# Patient Record
Sex: Female | Born: 1935 | Race: White | Hispanic: No | Marital: Married
Health system: Southern US, Community
[De-identification: ages and names within clinical notes are randomized; demographics above are authoritative.]

---

## 2009-11-06 ENCOUNTER — Ambulatory Visit: Payer: Self-pay | Admitting: Specialist

## 2009-11-07 ENCOUNTER — Ambulatory Visit: Payer: Self-pay | Admitting: Internal Medicine

## 2010-10-25 ENCOUNTER — Ambulatory Visit: Payer: Self-pay | Admitting: Internal Medicine

## 2010-10-30 ENCOUNTER — Ambulatory Visit: Payer: Self-pay | Admitting: Emergency Medicine

## 2010-10-31 ENCOUNTER — Ambulatory Visit: Payer: Self-pay | Admitting: Emergency Medicine

## 2010-11-05 ENCOUNTER — Ambulatory Visit: Payer: Self-pay | Admitting: Emergency Medicine

## 2010-11-27 ENCOUNTER — Ambulatory Visit: Payer: Self-pay | Admitting: Internal Medicine

## 2012-03-02 ENCOUNTER — Ambulatory Visit: Payer: Self-pay | Admitting: Internal Medicine

## 2013-03-03 ENCOUNTER — Ambulatory Visit: Payer: Self-pay | Admitting: Internal Medicine

## 2013-07-28 ENCOUNTER — Ambulatory Visit: Payer: Self-pay | Admitting: Internal Medicine

## 2014-01-18 ENCOUNTER — Ambulatory Visit: Payer: Self-pay | Admitting: Internal Medicine

## 2014-01-21 ENCOUNTER — Inpatient Hospital Stay: Payer: Self-pay | Admitting: Internal Medicine

## 2014-01-21 LAB — CBC WITH DIFFERENTIAL/PLATELET
BASOS ABS: 0 10*3/uL (ref 0.0–0.1)
Basophil %: 0.3 %
Eosinophil #: 0 10*3/uL (ref 0.0–0.7)
Eosinophil %: 0.2 %
HCT: 25.7 % — AB (ref 35.0–47.0)
HGB: 8.7 g/dL — AB (ref 12.0–16.0)
Lymphocyte #: 0.8 10*3/uL — ABNORMAL LOW (ref 1.0–3.6)
Lymphocyte %: 15.1 %
MCH: 30.5 pg (ref 26.0–34.0)
MCHC: 33.8 g/dL (ref 32.0–36.0)
MCV: 90 fL (ref 80–100)
Monocyte #: 0.8 x10 3/mm (ref 0.2–0.9)
Monocyte %: 14.1 %
NEUTROS ABS: 3.8 10*3/uL (ref 1.4–6.5)
NEUTROS PCT: 70.3 %
Platelet: 92 10*3/uL — ABNORMAL LOW (ref 150–440)
RBC: 2.84 10*6/uL — ABNORMAL LOW (ref 3.80–5.20)
RDW: 20.4 % — AB (ref 11.5–14.5)
WBC: 5.4 10*3/uL (ref 3.6–11.0)

## 2014-01-21 LAB — COMPREHENSIVE METABOLIC PANEL
AST: 25 U/L (ref 15–37)
Albumin: 3 g/dL — ABNORMAL LOW (ref 3.4–5.0)
Alkaline Phosphatase: 76 U/L
Anion Gap: 12 (ref 7–16)
BUN: 69 mg/dL — ABNORMAL HIGH (ref 7–18)
Bilirubin,Total: 1.3 mg/dL — ABNORMAL HIGH (ref 0.2–1.0)
CHLORIDE: 109 mmol/L — AB (ref 98–107)
Calcium, Total: 8.8 mg/dL (ref 8.5–10.1)
Co2: 22 mmol/L (ref 21–32)
Creatinine: 3.8 mg/dL — ABNORMAL HIGH (ref 0.60–1.30)
EGFR (African American): 12 — ABNORMAL LOW
EGFR (Non-African Amer.): 10 — ABNORMAL LOW
GLUCOSE: 165 mg/dL — AB (ref 65–99)
OSMOLALITY: 309 (ref 275–301)
POTASSIUM: 3.6 mmol/L (ref 3.5–5.1)
SGPT (ALT): 23 U/L (ref 12–78)
Sodium: 143 mmol/L (ref 136–145)
Total Protein: 6.6 g/dL (ref 6.4–8.2)

## 2014-01-21 LAB — CBC
HCT: 26.1 % — AB (ref 35.0–47.0)
HGB: 8.8 g/dL — AB (ref 12.0–16.0)
MCH: 32.4 pg (ref 26.0–34.0)
MCHC: 33.6 g/dL (ref 32.0–36.0)
MCV: 96 fL (ref 80–100)
Platelet: 177 10*3/uL (ref 150–440)
RBC: 2.71 10*6/uL — ABNORMAL LOW (ref 3.80–5.20)
RDW: 15.5 % — ABNORMAL HIGH (ref 11.5–14.5)
WBC: 8.3 10*3/uL (ref 3.6–11.0)

## 2014-01-21 LAB — PROTIME-INR
INR: >15.1
INR: 1.2
Prothrombin Time: 15.4 secs — ABNORMAL HIGH (ref 11.5–14.7)

## 2014-01-21 LAB — TROPONIN I

## 2014-01-21 LAB — CK TOTAL AND CKMB (NOT AT ARMC)
CK, TOTAL: 182 U/L
CK-MB: 1.1 ng/mL (ref 0.5–3.6)

## 2014-01-21 LAB — HEMOGLOBIN: HGB: 7.3 g/dL — AB (ref 12.0–16.0)

## 2014-01-22 LAB — CBC WITH DIFFERENTIAL/PLATELET
Basophil #: 0 10*3/uL (ref 0.0–0.1)
Basophil %: 0.5 %
EOS ABS: 0 10*3/uL (ref 0.0–0.7)
Eosinophil %: 0.8 %
HCT: 25 % — ABNORMAL LOW (ref 35.0–47.0)
HGB: 8.5 g/dL — AB (ref 12.0–16.0)
Lymphocyte #: 0.6 10*3/uL — ABNORMAL LOW (ref 1.0–3.6)
Lymphocyte %: 15.8 %
MCH: 30.8 pg (ref 26.0–34.0)
MCHC: 34.1 g/dL (ref 32.0–36.0)
MCV: 90 fL (ref 80–100)
Monocyte #: 0.6 x10 3/mm (ref 0.2–0.9)
Monocyte %: 13.9 %
NEUTROS ABS: 2.8 10*3/uL (ref 1.4–6.5)
Neutrophil %: 69 %
Platelet: 87 10*3/uL — ABNORMAL LOW (ref 150–440)
RBC: 2.76 10*6/uL — AB (ref 3.80–5.20)
RDW: 20.1 % — AB (ref 11.5–14.5)
WBC: 4.1 10*3/uL (ref 3.6–11.0)

## 2014-01-22 LAB — BASIC METABOLIC PANEL
ANION GAP: 7 (ref 7–16)
BUN: 66 mg/dL — ABNORMAL HIGH (ref 7–18)
CALCIUM: 8.3 mg/dL — AB (ref 8.5–10.1)
Chloride: 113 mmol/L — ABNORMAL HIGH (ref 98–107)
Co2: 27 mmol/L (ref 21–32)
Creatinine: 3.13 mg/dL — ABNORMAL HIGH (ref 0.60–1.30)
EGFR (African American): 15 — ABNORMAL LOW
EGFR (Non-African Amer.): 13 — ABNORMAL LOW
Glucose: 98 mg/dL (ref 65–99)
Osmolality: 311 (ref 275–301)
Potassium: 3.3 mmol/L — ABNORMAL LOW (ref 3.5–5.1)
Sodium: 147 mmol/L — ABNORMAL HIGH (ref 136–145)

## 2014-01-22 LAB — HEMOGLOBIN
HGB: 9.1 g/dL — ABNORMAL LOW (ref 12.0–16.0)
HGB: 8.9 g/dL — ABNORMAL LOW (ref 12.0–16.0)

## 2014-01-23 LAB — BASIC METABOLIC PANEL
Anion Gap: 8 (ref 7–16)
BUN: 55 mg/dL — ABNORMAL HIGH (ref 7–18)
CALCIUM: 8.4 mg/dL — AB (ref 8.5–10.1)
CO2: 26 mmol/L (ref 21–32)
CREATININE: 2.65 mg/dL — AB (ref 0.60–1.30)
Chloride: 115 mmol/L — ABNORMAL HIGH (ref 98–107)
EGFR (African American): 19 — ABNORMAL LOW
GFR CALC NON AF AMER: 16 — AB
Glucose: 108 mg/dL — ABNORMAL HIGH (ref 65–99)
OSMOLALITY: 312 (ref 275–301)
Potassium: 3.2 mmol/L — ABNORMAL LOW (ref 3.5–5.1)
SODIUM: 149 mmol/L — AB (ref 136–145)

## 2014-01-23 LAB — HEMOGLOBIN
HGB: 8.8 g/dL — AB (ref 12.0–16.0)
HGB: 10.5 g/dL — ABNORMAL LOW (ref 12.0–16.0)

## 2014-01-23 LAB — CANCER ANTIGEN 19-9: CA 19 9: 1647 U/mL — AB (ref 0–35)

## 2014-01-24 LAB — HEMOGLOBIN
HGB: 9.9 g/dL — AB (ref 12.0–16.0)
HGB: 9.1 g/dL — ABNORMAL LOW (ref 12.0–16.0)

## 2014-01-24 LAB — BASIC METABOLIC PANEL
ANION GAP: 8 (ref 7–16)
BUN: 44 mg/dL — ABNORMAL HIGH (ref 7–18)
CALCIUM: 8.4 mg/dL — AB (ref 8.5–10.1)
CO2: 25 mmol/L (ref 21–32)
Chloride: 116 mmol/L — ABNORMAL HIGH (ref 98–107)
Creatinine: 2.32 mg/dL — ABNORMAL HIGH (ref 0.60–1.30)
EGFR (Non-African Amer.): 19 — ABNORMAL LOW
GFR CALC AF AMER: 22 — AB
GLUCOSE: 121 mg/dL — AB (ref 65–99)
Osmolality: 309 (ref 275–301)
Potassium: 3.4 mmol/L — ABNORMAL LOW (ref 3.5–5.1)
Sodium: 149 mmol/L — ABNORMAL HIGH (ref 136–145)

## 2014-01-24 LAB — MAGNESIUM: MAGNESIUM: 1.8 mg/dL

## 2014-01-25 LAB — HEMOGLOBIN
HGB: 10.5 g/dL — ABNORMAL LOW (ref 12.0–16.0)
HGB: 9.6 g/dL — ABNORMAL LOW (ref 12.0–16.0)

## 2014-01-26 LAB — COMPREHENSIVE METABOLIC PANEL
ALBUMIN: 2.5 g/dL — AB (ref 3.4–5.0)
ALK PHOS: 192 U/L — AB
ANION GAP: 6 — AB (ref 7–16)
AST: 169 U/L — AB (ref 15–37)
BILIRUBIN TOTAL: 6.9 mg/dL — AB (ref 0.2–1.0)
BUN: 37 mg/dL — AB (ref 7–18)
CHLORIDE: 111 mmol/L — AB (ref 98–107)
Calcium, Total: 8.5 mg/dL (ref 8.5–10.1)
Co2: 26 mmol/L (ref 21–32)
Creatinine: 2.47 mg/dL — ABNORMAL HIGH (ref 0.60–1.30)
EGFR (Non-African Amer.): 17 — ABNORMAL LOW
GFR CALC AF AMER: 20 — AB
GLUCOSE: 123 mg/dL — AB (ref 65–99)
Osmolality: 295 (ref 275–301)
POTASSIUM: 3.4 mmol/L — AB (ref 3.5–5.1)
SGPT (ALT): 176 U/L — ABNORMAL HIGH (ref 12–78)
Sodium: 143 mmol/L (ref 136–145)
TOTAL PROTEIN: 5.4 g/dL — AB (ref 6.4–8.2)

## 2014-01-27 DIAGNOSIS — D62 Acute posthemorrhagic anemia: Secondary | ICD-10-CM

## 2014-01-27 DIAGNOSIS — M129 Arthropathy, unspecified: Secondary | ICD-10-CM

## 2014-01-27 DIAGNOSIS — K922 Gastrointestinal hemorrhage, unspecified: Secondary | ICD-10-CM

## 2014-01-27 DIAGNOSIS — N189 Chronic kidney disease, unspecified: Secondary | ICD-10-CM

## 2014-01-27 DIAGNOSIS — C259 Malignant neoplasm of pancreas, unspecified: Secondary | ICD-10-CM

## 2014-02-02 ENCOUNTER — Ambulatory Visit: Payer: Self-pay | Admitting: Oncology

## 2014-02-02 LAB — CBC CANCER CENTER
Basophil #: 0 x10 3/mm (ref 0.0–0.1)
Basophil %: 0.8 %
EOS PCT: 0.5 %
Eosinophil #: 0 x10 3/mm (ref 0.0–0.7)
HCT: 30.3 % — AB (ref 35.0–47.0)
HGB: 10.1 g/dL — ABNORMAL LOW (ref 12.0–16.0)
LYMPHS PCT: 9.7 %
Lymphocyte #: 0.6 x10 3/mm — ABNORMAL LOW (ref 1.0–3.6)
MCH: 30.8 pg (ref 26.0–34.0)
MCHC: 33.4 g/dL (ref 32.0–36.0)
MCV: 92 fL (ref 80–100)
MONO ABS: 0.7 x10 3/mm (ref 0.2–0.9)
Monocyte %: 11.2 %
NEUTROS ABS: 5 x10 3/mm (ref 1.4–6.5)
Neutrophil %: 77.8 %
PLATELETS: 151 x10 3/mm (ref 150–440)
RBC: 3.29 10*6/uL — ABNORMAL LOW (ref 3.80–5.20)
RDW: 21.6 % — ABNORMAL HIGH (ref 11.5–14.5)
WBC: 6.4 x10 3/mm (ref 3.6–11.0)

## 2014-02-02 LAB — COMPREHENSIVE METABOLIC PANEL
ALBUMIN: 2.7 g/dL — AB (ref 3.4–5.0)
ALK PHOS: 736 U/L — AB
Anion Gap: 11 (ref 7–16)
BUN: 50 mg/dL — ABNORMAL HIGH (ref 7–18)
Bilirubin,Total: 18.6 mg/dL — ABNORMAL HIGH (ref 0.2–1.0)
CREATININE: 2.87 mg/dL — AB (ref 0.60–1.30)
Calcium, Total: 9.4 mg/dL (ref 8.5–10.1)
Chloride: 102 mmol/L (ref 98–107)
Co2: 31 mmol/L (ref 21–32)
EGFR (Non-African Amer.): 15 — ABNORMAL LOW
GFR CALC AF AMER: 17 — AB
GLUCOSE: 132 mg/dL — AB (ref 65–99)
OSMOLALITY: 302 (ref 275–301)
POTASSIUM: 2.8 mmol/L — AB (ref 3.5–5.1)
SGOT(AST): 372 U/L — ABNORMAL HIGH (ref 15–37)
SGPT (ALT): 378 U/L — ABNORMAL HIGH (ref 12–78)
SODIUM: 144 mmol/L (ref 136–145)
TOTAL PROTEIN: 6.1 g/dL — AB (ref 6.4–8.2)

## 2014-02-03 LAB — CANCER ANTIGEN 19-9: CA 19-9: 3470 U/mL — ABNORMAL HIGH (ref 0–35)

## 2014-02-08 DIAGNOSIS — N179 Acute kidney failure, unspecified: Secondary | ICD-10-CM

## 2014-02-08 LAB — COMPREHENSIVE METABOLIC PANEL
ANION GAP: 6 — AB (ref 7–16)
Albumin: 2.6 g/dL — ABNORMAL LOW (ref 3.4–5.0)
Alkaline Phosphatase: 398 U/L — ABNORMAL HIGH
BUN: 45 mg/dL — AB (ref 7–18)
Bilirubin,Total: 4.6 mg/dL — ABNORMAL HIGH (ref 0.2–1.0)
CHLORIDE: 102 mmol/L (ref 98–107)
Calcium, Total: 8.7 mg/dL (ref 8.5–10.1)
Co2: 31 mmol/L (ref 21–32)
Creatinine: 3.1 mg/dL — ABNORMAL HIGH (ref 0.60–1.30)
EGFR (Non-African Amer.): 13 — ABNORMAL LOW
GFR CALC AF AMER: 15 — AB
Glucose: 191 mg/dL — ABNORMAL HIGH (ref 65–99)
OSMOLALITY: 294 (ref 275–301)
POTASSIUM: 4 mmol/L (ref 3.5–5.1)
SGOT(AST): 62 U/L — ABNORMAL HIGH (ref 15–37)
SGPT (ALT): 133 U/L — ABNORMAL HIGH (ref 12–78)
Sodium: 139 mmol/L (ref 136–145)
Total Protein: 6.4 g/dL (ref 6.4–8.2)

## 2014-02-08 LAB — CBC CANCER CENTER
BANDS NEUTROPHIL: 1 %
EOS PCT: 1 %
HCT: 32.1 % — ABNORMAL LOW (ref 35.0–47.0)
HGB: 10.6 g/dL — AB (ref 12.0–16.0)
LYMPHS PCT: 8 %
MCH: 32.4 pg (ref 26.0–34.0)
MCHC: 33.1 g/dL (ref 32.0–36.0)
MCV: 98 fL (ref 80–100)
Monocytes: 6 %
PLATELETS: 149 x10 3/mm — AB (ref 150–440)
RBC: 3.29 10*6/uL — ABNORMAL LOW (ref 3.80–5.20)
RDW: 22.5 % — AB (ref 11.5–14.5)
SEGMENTED NEUTROPHILS: 84 %
WBC: 7.4 x10 3/mm (ref 3.6–11.0)

## 2014-02-13 ENCOUNTER — Ambulatory Visit: Payer: Self-pay | Admitting: Family Medicine

## 2014-02-17 ENCOUNTER — Ambulatory Visit: Payer: Self-pay | Admitting: Oncology

## 2014-02-17 ENCOUNTER — Ambulatory Visit: Payer: Self-pay | Admitting: Internal Medicine

## 2014-02-24 DIAGNOSIS — R791 Abnormal coagulation profile: Secondary | ICD-10-CM

## 2014-02-24 DIAGNOSIS — C259 Malignant neoplasm of pancreas, unspecified: Secondary | ICD-10-CM

## 2014-02-24 DIAGNOSIS — M129 Arthropathy, unspecified: Secondary | ICD-10-CM

## 2014-02-24 DIAGNOSIS — K922 Gastrointestinal hemorrhage, unspecified: Secondary | ICD-10-CM

## 2014-02-27 ENCOUNTER — Ambulatory Visit: Payer: Self-pay | Admitting: Vascular Surgery

## 2014-03-03 LAB — CBC CANCER CENTER
BASOS ABS: 0.1 x10 3/mm (ref 0.0–0.1)
Basophil %: 0.9 %
EOS ABS: 0.2 x10 3/mm (ref 0.0–0.7)
Eosinophil %: 2.4 %
HCT: 32.8 % — ABNORMAL LOW (ref 35.0–47.0)
HGB: 10.9 g/dL — ABNORMAL LOW (ref 12.0–16.0)
Lymphocyte #: 0.6 x10 3/mm — ABNORMAL LOW (ref 1.0–3.6)
Lymphocyte %: 9.1 %
MCH: 32.9 pg (ref 26.0–34.0)
MCHC: 33.2 g/dL (ref 32.0–36.0)
MCV: 99 fL (ref 80–100)
Monocyte #: 0.8 x10 3/mm (ref 0.2–0.9)
Monocyte %: 11.7 %
NEUTROS ABS: 5.1 x10 3/mm (ref 1.4–6.5)
Neutrophil %: 75.9 %
Platelet: 84 x10 3/mm — ABNORMAL LOW (ref 150–440)
RBC: 3.31 10*6/uL — ABNORMAL LOW (ref 3.80–5.20)
RDW: 15.9 % — ABNORMAL HIGH (ref 11.5–14.5)
WBC: 6.8 x10 3/mm (ref 3.6–11.0)

## 2014-03-03 LAB — COMPREHENSIVE METABOLIC PANEL
ALK PHOS: 135 U/L — AB
AST: 24 U/L (ref 15–37)
Albumin: 2.7 g/dL — ABNORMAL LOW (ref 3.4–5.0)
Anion Gap: 9 (ref 7–16)
BUN: 48 mg/dL — ABNORMAL HIGH (ref 7–18)
Bilirubin,Total: 2.1 mg/dL — ABNORMAL HIGH (ref 0.2–1.0)
CHLORIDE: 106 mmol/L (ref 98–107)
Calcium, Total: 8.4 mg/dL — ABNORMAL LOW (ref 8.5–10.1)
Co2: 28 mmol/L (ref 21–32)
Creatinine: 2.98 mg/dL — ABNORMAL HIGH (ref 0.60–1.30)
GFR CALC AF AMER: 16 — AB
GFR CALC NON AF AMER: 14 — AB
GLUCOSE: 169 mg/dL — AB (ref 65–99)
OSMOLALITY: 302 (ref 275–301)
POTASSIUM: 4 mmol/L (ref 3.5–5.1)
SGPT (ALT): 13 U/L (ref 12–78)
Sodium: 143 mmol/L (ref 136–145)
TOTAL PROTEIN: 6.1 g/dL — AB (ref 6.4–8.2)

## 2014-03-07 ENCOUNTER — Telehealth: Payer: Self-pay

## 2014-03-07 NOTE — Telephone Encounter (Signed)
Jeanette Graham with [facility] left v/m; Hospice had received referral for hospice and pt was getting therapy at [facility] and pt was using her medicare days so Hospice could not see pt at that time but Jeanette Graham understands pt is returning to independent living at [facility] and does Dr Deborra Medina still wanting [facility] to see pt.Jeanette Graham request cb.

## 2014-03-08 NOTE — Telephone Encounter (Signed)
Spoke to [facility] and advised per Dr Deborra Medina; verbally expressed understanding.

## 2014-03-08 NOTE — Telephone Encounter (Signed)
I am not her PCP outside of [place] but I would highly recommend her still seeing hospice outside of [place].  This would be up to the patient.

## 2014-03-09 DIAGNOSIS — S8010XA Contusion of unspecified lower leg, initial encounter: Secondary | ICD-10-CM

## 2014-03-09 DIAGNOSIS — C259 Malignant neoplasm of pancreas, unspecified: Secondary | ICD-10-CM

## 2014-03-09 DIAGNOSIS — R5381 Other malaise: Secondary | ICD-10-CM

## 2014-03-09 DIAGNOSIS — R5383 Other fatigue: Secondary | ICD-10-CM

## 2014-03-09 DIAGNOSIS — W19XXXA Unspecified fall, initial encounter: Secondary | ICD-10-CM

## 2014-03-10 LAB — CBC CANCER CENTER
Basophil #: 0 x10 3/mm (ref 0.0–0.1)
Basophil %: 0.5 %
Eosinophil #: 0.1 x10 3/mm (ref 0.0–0.7)
Eosinophil %: 2.7 %
HCT: 28.6 % — ABNORMAL LOW (ref 35.0–47.0)
HGB: 9.6 g/dL — AB (ref 12.0–16.0)
LYMPHS ABS: 0.3 x10 3/mm — AB (ref 1.0–3.6)
Lymphocyte %: 15 %
MCH: 33.3 pg (ref 26.0–34.0)
MCHC: 33.6 g/dL (ref 32.0–36.0)
MCV: 99 fL (ref 80–100)
Monocyte #: 0.1 x10 3/mm — ABNORMAL LOW (ref 0.2–0.9)
Monocyte %: 3.4 %
Neutrophil #: 1.7 x10 3/mm (ref 1.4–6.5)
Neutrophil %: 78.4 %
Platelet: 23 x10 3/mm — CL (ref 150–440)
RBC: 2.88 10*6/uL — AB (ref 3.80–5.20)
RDW: 14.6 % — AB (ref 11.5–14.5)
WBC: 2.1 x10 3/mm — ABNORMAL LOW (ref 3.6–11.0)

## 2014-03-10 LAB — COMPREHENSIVE METABOLIC PANEL
ALBUMIN: 2.5 g/dL — AB (ref 3.4–5.0)
ALK PHOS: 95 U/L
Anion Gap: 10 (ref 7–16)
BILIRUBIN TOTAL: 1.7 mg/dL — AB (ref 0.2–1.0)
BUN: 52 mg/dL — AB (ref 7–18)
CREATININE: 2.91 mg/dL — AB (ref 0.60–1.30)
Calcium, Total: 7.9 mg/dL — ABNORMAL LOW (ref 8.5–10.1)
Chloride: 104 mmol/L (ref 98–107)
Co2: 28 mmol/L (ref 21–32)
EGFR (African American): 17 — ABNORMAL LOW
EGFR (Non-African Amer.): 14 — ABNORMAL LOW
Glucose: 188 mg/dL — ABNORMAL HIGH (ref 65–99)
OSMOLALITY: 302 (ref 275–301)
POTASSIUM: 3.8 mmol/L (ref 3.5–5.1)
SGOT(AST): 30 U/L (ref 15–37)
SGPT (ALT): 21 U/L (ref 12–78)
Sodium: 142 mmol/L (ref 136–145)
Total Protein: 5.8 g/dL — ABNORMAL LOW (ref 6.4–8.2)

## 2014-03-12 LAB — CANCER ANTIGEN 19-9: CA 19 9: 3674 U/mL — AB (ref 0–35)

## 2014-03-17 DIAGNOSIS — C259 Malignant neoplasm of pancreas, unspecified: Secondary | ICD-10-CM

## 2014-03-17 DIAGNOSIS — R609 Edema, unspecified: Secondary | ICD-10-CM

## 2014-03-20 ENCOUNTER — Ambulatory Visit: Payer: Self-pay | Admitting: Internal Medicine

## 2014-03-20 ENCOUNTER — Ambulatory Visit: Payer: Self-pay | Admitting: Oncology

## 2014-03-21 DIAGNOSIS — C259 Malignant neoplasm of pancreas, unspecified: Secondary | ICD-10-CM

## 2014-03-21 DIAGNOSIS — K219 Gastro-esophageal reflux disease without esophagitis: Secondary | ICD-10-CM

## 2014-03-21 DIAGNOSIS — I10 Essential (primary) hypertension: Secondary | ICD-10-CM

## 2014-03-21 DIAGNOSIS — N185 Chronic kidney disease, stage 5: Secondary | ICD-10-CM

## 2014-03-22 ENCOUNTER — Ambulatory Visit: Payer: Medicare Other | Admitting: Internal Medicine

## 2014-04-10 DIAGNOSIS — C7800 Secondary malignant neoplasm of unspecified lung: Secondary | ICD-10-CM

## 2014-04-10 DIAGNOSIS — C259 Malignant neoplasm of pancreas, unspecified: Secondary | ICD-10-CM

## 2014-04-10 DIAGNOSIS — K922 Gastrointestinal hemorrhage, unspecified: Secondary | ICD-10-CM

## 2014-04-10 DIAGNOSIS — C787 Secondary malignant neoplasm of liver and intrahepatic bile duct: Secondary | ICD-10-CM

## 2014-04-11 DIAGNOSIS — C259 Malignant neoplasm of pancreas, unspecified: Secondary | ICD-10-CM

## 2014-04-12 ENCOUNTER — Telehealth: Payer: Self-pay | Admitting: Family Medicine

## 2014-04-19 NOTE — Telephone Encounter (Signed)
Confidential Office Message [address] [place], [place] [address] p. [phone] f. [phone] To: Virgel Manifold (After Hours Triage) Fax: [phone] From: [facility] Date/ Time: 2014-04-23 4:56 AM Taken By: Lind Covert, RN Caller: [place]: [facility] Patient: Jeanette Graham, Jeanette Graham DOB: 12/07/1927 Phone: [phone] Reason for Call: Benjamine Mola is calling from [facility] regarding the death of Justin Buechner. Patient of Viviana Simpler [facility]). The patient expired on Apr 23, 2014 at 04:30. Regarding Appointment: Appt Date: Appt Time: Unknown Provider: Reason: Details: Confidential Outcome:

## 2014-04-19 NOTE — Telephone Encounter (Signed)
This was expected I spoke to the daughter about this yesterday

## 2014-04-19 DEATH — deceased

## 2015-01-05 IMAGING — CT NM PET TUM IMG RESTAG (PS) SKULL BASE T - THIGH
1 of 9 series · 1 of 25 positions shown · non-contrast
Comparison: 01/21/2014 chest, abdomen, and pelvic CT.

CLINICAL DATA: Initial treatment strategy for restaging of
pancreatic adenocarcinoma..

EXAM:
NUCLEAR MEDICINE PET SKULL BASE TO THIGH
TECHNIQUE: 13.1 mCi F-18 FDG was injected intravenously. Full-ring PET imaging
was performed from the skull base to thigh after the radiotracer. CT
data was obtained and used for attenuation correction and anatomic
localization.
FASTING BLOOD GLUCOSE:  Value: 121 mg/dl

[Series 3: ct wb 5.0 b30f · axial · 5.0mm · 0.98mm/px · 1 of 290 slices shown]
[im 290/290  brain]
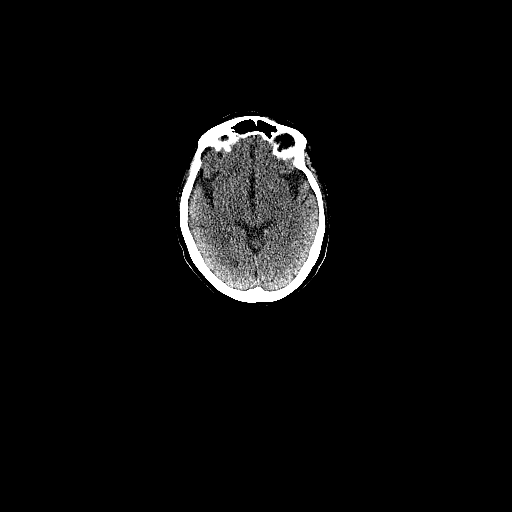

[1 of 25 positions shown; findings below may reference images not displayed]

FINDINGS: NECK

No areas of abnormal hypermetabolism.

CHEST

No areas of abnormal hypermetabolism.

ABDOMEN/PELVIS

Hypermetabolic pancreatic head mass. On the order of 4.4 x 4.2 cm
and a S.U.V. max of 15.2. Image 137/series 3.

Multiple hypermetabolic foci within the liver, consistent with
metastatic disease. Subtle lesion within the high right lobe of the
liver posteriorly and medially measures a S.U.V. max of 8.2 on image
106/series 3. An immediately lateral focus of hypermetabolism is
relatively CT occult and measures a S.U.V. max of 6.6 on image
106/series 3.

Inferior right hepatic lobe lesion measures 1.3 cm and a S.U.V. max
of 6.3 on image 131.

A peripancreatic node measures 9 mm and a S.U.V. max of 2.9 on image
145/ series 3. Indeterminate.

SKELETON

Right upper extremity hypermetabolism, suggesting
radiopharmaceutical extravasation. Suboptimally imaged. Right
rotator cuff hypermetabolism which is likely degenerative.

CT IMAGES PERFORMED FOR ATTENUATION CORRECTION

New complete opacification of the left side of the sphenoid sinus.
No cervical adenopathy. Dense carotid atherosclerosis.

Pulmonary artery enlargement, with the outflow tract measuring
cm. Mild cardiomegaly with coronary artery atherosclerosis. Trace
right pleural fluid is similar. Left-sided pleural effusion has
resolved since the prior CT. Calcified granuloma on the right lung
base anteriorly. Right base subsegmental atelectasis. Nonspecific 3
mm right lower lobe lung nodule on image 96. Left lung base 7 mm
nodule is indeterminate.

New small volume abdominal pelvic ascites. Pneumobilia, likely due
to interval placement of a common duct stent. The gallbladder
demonstrates borderline distension, similar to slightly improved.
Nonspecific edema within the right upper abdomen. Bilateral renal
lesions as detailed on prior diagnostic CT. IVC filter.
Hysterectomy. Osteopenia.
IMPRESSION: 1. Pancreatic head adenocarcinoma with hypermetabolic liver foci,
most consistent with metastatic disease.
2. Indeterminate small pulmonary nodules. Cannot exclude early
pulmonary metastasis.
3. Development of small volume abdominal pelvic ascites since
01/21/2014. Interval placement of a common duct stent with
pneumobilia.
4. Similar to slight decrease in gallbladder distention. Nonspecific
upper abdominal edema. Correlate with symptoms to exclude a
component of cholecystitis.
5. Pulmonary artery enlargement suggests pulmonary arterial
hypertension.
6. Right upper extremity hypermetabolism, suggesting a component of
tracer extravasation. Consider physical exam correlation.

## 2015-02-10 NOTE — Consult Note (Signed)
CC LGI bleed.  Pt with stable hgb 9.1, plt 87K, creat 3.13, PT 15.4, INR 1.2, VSS afebrile.  Passed some brown black stool, likely old blood given stable hgb.  Would be cautious restarting coumadin.  Await MRI of pancreas and CA 19-9.  Will start clear liquid diet.  If do endoscopic work up for bleeding would start with EGD first and then do colonoscopy if able to drink the prep.  Electronic Signatures: Manya Silvas (MD)  (Signed on 05-Apr-15 12:11)  Authored  Last Updated: 05-Apr-15 12:11 by Manya Silvas (MD)

## 2015-02-10 NOTE — Discharge Summary (Signed)
PATIENT NAME:  Jeanette Graham, Jeanette Graham MR#:  [id number] DATE OF BIRTH:  July 10, 1928  DATE OF ADMISSION:  01/21/2014 DATE OF DISCHARGE: 01/26/2014   ADMITTING PHYSICIAN: Dr. Waldron Labs.  DISCHARGING PHYSICIAN: Dr. Elijio Miles.  DISCHARGE DIAGNOSES:  1.  Gastrointestinal hemorrhage.  2.  Acute blood loss anemia.  3.  Pancreatic mass, presumably cancer.  4.  Hypertension.  5.  Acute on chronic kidney disease. 6.  Supratherapeutic INR. 7.  History of deep venous thrombosis, on long-term anticoagulation.  PROCEDURES:  1.  Fresh frozen plasma transfusion.  2.  Packed red cells blood transfusion.  3.  MRI of the abdomen.  4.  CT of the abdomen.  5.  IVC filter placement.  CONSULTATIONS:  1.  Gastroenterology, Dr. Vira Agar. 2.  Hematology/oncology, Dr. Grayland Ormond. 3.  Nephrology, Dr. Juleen China.  4.  Palliative care, Dr. Ermalinda Memos. 5.  Dr. Delana Meyer, vascular surgery.  IMAGING:  1.  MRI of the abdomen showed a pancreatic mass.  2.  CT scan of the abdomen showed a pancreatic mass.  3.  PET scan pending.   HOSPITAL COURSE: This lady was admitted through the Emergency Room with a supratherapeutic INR of 15 with bleeding per rectum. Her hemoglobin was also markedly low at  7.3. Please refer to the history and physical for full details. The patient was reversed with fresh frozen plasma. She was transfused 2 units of packed red cells. Hemoglobin rose appropriately and remained stable thereafter. The patient'Carlene Bickley GI bleed resolved. Hemoglobin remained between 9 and 10 during the rest of her stay. CT scan of the abdomen done Emergency Room for evaluation of GI bleed revealed a 4 cm pancreatic mass. This was later confirmed on MRI. The patient could not receive contrast due to acute on chronic kidney disease. Oncology consultation was placed. Calcium 19-9 was ordered, which was markedly elevated also consistent with a diagnosis of possible pancreatic cancer. Due to that finding, consultant gastroenterology, Dr. Vira Agar  decided not to pursue luminal investigations to identify a source of bleeding. Due to the fact that her source of bleeding could not be identified, I referred the patient to vascular surgery for an IVC filter placement to prevent pulmonary embolism for her chronic DVTs. The patient underwent IVC filter placement by Dr. Delana Meyer without any complications. She was evaluated by palliative care, Dr. Ermalinda Memos. The patient, at this time, is still undecided as to how aggressively she wants her pancreatic cancer treated. The patient developed acute on chronic kidney failure, which responded to intravenous fluids. Creatinine decreased to her baseline and she remained non-oliguric throughout her stay. The patient'Nixon Sparr pain is controlled with morphine, although this was not a major complaint of hers. She is being discharged to a skilled nursing facility in a satisfactory condition.   DISCHARGE MEDICATIONS:  1.  Dilaudid 2 mg p.r.n. every 4 hours. 2.  Tramadol 50 mg twice a day as needed. 3.  Folic acid 1 mcg daily. 4.  Fosamax 40 mg daily p.r.n. 5.  Metoprolol succinate 100 mg daily.  6.  Sodium bicarbonate 325 mg orally.  7.  The patient is to stop her Coumadin.   DISCHARGE INSTRUCTIONS:  DIET: Low sodium.   ACTIVITY: As tolerated.   FOLLOWUP: With Dr. Grayland Ormond in 1 to 2 weeks pending results of PET scan scheduled to be performed today.   DISCHARGE PROCESS TIME SPENT: 35 minutes.  ____________________________ Venetia Maxon Elijio Miles, MD sat:aw D: 01/26/2014 09:22:09 ET T: 01/26/2014 09:46:03 ET JOB#: [id number]  cc: Sheikh A. Elijio Miles, MD, <Dictator> [facility] A  TEJAN-SIE MD ELECTRONICALLY SIGNED 02/06/2014 13:07

## 2015-02-10 NOTE — Consult Note (Signed)
PATIENT NAME:  Jeanette Graham, Jeanette Graham MR#:  [id number] DATE OF BIRTH:  07-20-1928  DATE OF CONSULTATION:  01/21/2014  CONSULTING PHYSICIAN:  Manya Silvas, MD  The patient is an 81 year old white female resident of [place] originally from [place] who has been on Coumadin for a few years due to DVTs in her legs. She last had her blood checked a couple of weeks ago. She is unaware of the results. However, yesterday 2 days ago she started passing bright red blood per rectum and this continued and she came to the ER and was found to have major lower GI bleeding with severe thinning of her blood and was admitted to the hospital.   In the ER she received fresh frozen plasma several units, as well as vitamin K and 1 unit of blood. She had been moved to the Intensive Care Unit where I saw her for consultation.   PAST MEDICAL HISTORY: Includes colonoscopy 10 years ago with negative findings. She had kidney deterioration some years ago and had a shunt put in her left arm in anticipation of dialysis and this was never used. Her deterioration in kidney function leveled out so that it was not needed.   HABITS: Nonsmoker, nondrinker.   PAST MEDICAL HISTORY: Includes appendectomy at age 47, a hysterectomy in her late 2s and several breast biopsies.   FAMILY HISTORY: Positive for colon cancer in her grandfather who died at 65.  REVIEW OF SYSTEMS: She does have some balance trouble. No severe headaches. She has some blood in the sclerae in the eye, probably from scratching her eye in her sleep. She noticed it this morning. She denies any sores in her mouth. No dysphagia. No significant heartburn. No nausea or vomiting. No prior ulcer disease. She does have trouble with constipation, alternating with diarrhea and carries a diagnosis of irritable bowel for several years. Never had a heart attack. Never had a stroke. No exertional chest pains. No dysuria or hematuria. A CAT scan of the abdomen on admission  showed a mass in the pancreas. She did not get IV contrast because of an allergy to shellfish and iodine.   MEDICATIONS: Include the following: Tramadol 50 mg 1/2 to 1 tablet 2 times a day, bicarb 325 mg orally, metoprolol 1 tablet a day, iron 27 mg 1 a day, furosemide 40 mg once a day, folic acid 676 mg once a day, Coumadin 1 pill a day, dose uncertain.   PHYSICAL EXAMINATION: GENERAL: Elderly white female, alert, oriented, seen with family members present. HEENT:  Sclerae nonicteric. There is some conjunctival hemorrhage on the right eye.  Tongue is somewhat pale. The head is atraumatic otherwise. NECK: Shows no carotid bruit on the left. She has a jugular catheter on the right.  CHEST: Clear.  HEART: Shows a few irregular beats. No murmurs or gallops I can hear.  ABDOMEN: Soft. Bowel sounds present. No hepatosplenomegaly. No masses. No bruits. No significant tenderness.  EXTREMITIES: No edema.  The patient had rectal bleeding in the ER.  LABORATORY DATA: Glucose 165, BUN 69, creatinine 3.8. Sodium 143, potassium 3.6, chloride 109, CO2 of 22, calcium 8.8, total protein 6.6, albumin 3, total bili 1.3, alk phos 76, SGOT 25, SGPT 23, CPK 182. Troponin less than 0.02. White count 8.3, hemoglobin 8.8, hematocrit 26, platelet count 177. She has A+ blood with a negative antibody screen. On admission her pro time was greater than 120, INR greater than 15.   CAT scan of the abdomen  showed chest, abdomen and pelvis was done, and suspicion of a pancreatic mass with associated atrophy and pancreatic ductal dilatation concerning for pancreatic adenocarcinoma. The finding is suboptimally evaluated because done without IV contrast. Possible adjacent lymph nodes. Possible metastasis. Small pleural effusions. Exophytic  renal mass bilaterally. Both kidneys demonstrate cortical thinning and exophytic lesions. The stomach, small bowel and colon showed no acute findings. No evidence of retroperitoneal or  intra-abdominal hematoma. The mass in the pancreas is 4.4 x 3.9 cm. The body and the tail are atrophied. The pancreatic duct is dilated at 9 mm. Mild pericholecystic edema. Some suspicion of intra- and extrahepatic biliary dilatation.   ASSESSMENT: Probable lower gastrointestinal bleed, most likely source would be diverticular in nature, possible arteriovenous malformations, possible ulcerated neoplasm, possible internal hemorrhoids. Obviously bleeding can occur with significant amounts from very minimal mucosal abnormalities given the severe degree of hyperanticoagulation.   RECOMMENDATIONS: 1.  Continue to monitor PT and CBC. Continue to correct over anticoagulation.  2.  Would give MRI of the pancreatic mass and get a blood test or CA-19-9 and CEA. 3.  Consideration for colonoscopy and upper endoscopy when her situation is more stable.  I will follow with you.   ____________________________ Manya Silvas, MD rte:ce D: 01/21/2014 13:40:39 ET T: 01/21/2014 13:59:12 ET JOB#: [id number]  cc: Manya Silvas, MD, <Dictator> Albertine Patricia, MD Perrin Maltese, MD  Manya Silvas MD ELECTRONICALLY SIGNED 01/30/2014 14:18

## 2015-02-10 NOTE — H&P (Signed)
PATIENT NAME:  Jeanette Graham, Jeanette Graham MR#:  [id number] DATE OF BIRTH:  12-Oct-1928  DATE OF ADMISSION:  01/21/2014  REFERRING PHYSICIAN: Elta Guadeloupe R. Jacqualine Code, MD   PRIMARY CARE PHYSICIAN: Perrin Maltese, MD  CHIEF COMPLAINT: Lower back pain, anemia, bright red blood per rectum.   HISTORY OF PRESENT ILLNESS: This is an 81 year old female with known past medical history of multiple DVTs, on anticoagulation with warfarin, chronic kidney disease, already has left arm AV fistula for anticipation of hemodialysis, hypertension and hyperlipidemia. The patient presents with complaints of lower back pain as well as significant bruising over the last few days. The patient reports she has been having back pain over the last few days. Reports it is all over her back. Reports she has degenerative disk disease as well as lumbar radiculopathy, with pain worsened over the last few days. As well, the patient reports she has easy bruising over multiple spots on her face, including having subconjunctival hemorrhage and tongue bruising,and skin bruising. The patient's hemoglobin was found to be 8.8. The patient is on warfarin for DVT. Her INR was found to be more than 15. The patient had dry CT head, neck, chest, abdomen and pelvis which did not show any evidence of hematoma or fluid collection. The patient reports having bright red blood per rectum over the last few days as well and dark-colored stools. The patient received 10 of IV vitamin K in ED and already received 4 units of FFP, and she is receiving 1 unit of packed red blood cells. As well, the patient's hemoglobin was found to be 8.8. Her last hemoglobin known, when discussed with nephrology, as per their record, it was 12.1. The patient denies any coffee-ground emesis, any nausea, any chest pain, any diarrhea, any jaundice. The patient had incidental finding of CT abdomen and pelvis showing possible pancreatic mass.   ALLERGIES: IODINE AND SHELLFISH.   PAST MEDICAL HISTORY:  1.  Chronic kidney disease. 2. Hypertension. 3. Irritable bowel syndrome. 4. Hyperlipidemia.  5. Arthritis.  6. History of DVT in the past x2, on anticoagulation.   PAST SURGICAL HISTORY:  1. Appendectomy.  2. Hysterectomy.  3. Breast biopsy.  4. Hernia repair.  5. Dialysis port.   SOCIAL HISTORY: No smoking. No alcohol. Lives at home.   FAMILY HISTORY: Significant for congestive heart failure in her both parents.   HOME MEDICATIONS:  1. Tramadol 50 half-tablet 2 times a day as needed.  2. Sodium bicarbonate 325 mg oral.  3. Warfarin.  4. Metoprolol ER 100 mg oral daily.  5. Lasix 40 mg oral as needed.  6. Folic acid 299 mcg oral daily.  7. Iron supplement 25 mg oral daily.   REVIEW OF SYSTEMS:  CONSTITUTIONAL: The patient denies fever, chills. Complains of fatigue, weakness. Denies weight gain, weight loss.  EYES: Denies blurry vision, double vision, inflammation, glaucoma.  ENT: Denies tinnitus, ear pain, hearing loss, epistaxis or discharge.   RESPIRATORY: Denies cough, wheezing, hemoptysis, dyspnea or COPD.  CARDIOVASCULAR: Denies chest pain, edema, palpitation, syncope.  GASTROINTESTINAL: Denies nausea, vomiting, diarrhea, abdominal pain, hematemesis. Reports dark-colored stools and bright blood per rectum.  GENITOURINARY: Denies dysuria, hematuria, renal colic.  ENDOCRINE: Denies polyuria, polydipsia, heat or cold intolerance.  HEMATOLOGY: Denies anemia or bleeding diathesis.  INTEGUMENTARY: Denies acne, rash or skin lesion, but reports easy bruising.  MUSCULOSKELETAL: Denies any gout, any swelling, any cramps. Reports history of arthritis, reports lower back pain. NEUROLOGIC: Denies any CVA, TIA, ataxia, vertigo, tremors, headache.  PSYCHIATRIC: Denies  anxiety, insomnia, depression or schizophrenia.   PHYSICAL EXAMINATION:  VITAL SIGNS: Temperature 98.2, pulse 82, respiratory rate 18, blood pressure 147/70, saturating 98% on oxygen.  GENERAL: Elderly female who looks  comfortable, in no apparent distress.  HEENT: Head atraumatic, normocephalic. Pupils equally reactive to light. Has subconjunctival hemorrhage in the right eye. Has as well lip bruising and tongue bruising as well, but no bleeding can be noticed in oral cavity.  NECK: Supple. No thyromegaly. No JVD.  CHEST: Good air entry bilaterally. No wheezing, rales, rhonchi.  CARDIOVASCULAR: S1, S2 heard. No rubs, murmur or gallops.  ABDOMEN: Soft, nontender, nondistended. Bowel sounds present.  EXTREMITIES: No edema. No clubbing. No cyanosis. Pedal and radial pulses +2 bilaterally.  PSYCHIATRIC: Appropriate affect. Awake, alert x3. Intact judgment and insight.  NEUROLOGIC: Cranial nerves grossly intact. Motor 5 out of 5. No focal deficits. Had good ambulation and sensation in her lower extremity.  SKIN: Has multiple skin bruising in multiple areas, most significant in the left elbow.  MUSCULOSKELETAL: No joint effusion or erythema. Has lower back tenderness to palpation, which she reports it is chronic, related to her radiculopathy.  LYMPHATIC: No cervical lymphadenopathy could be appreciated.   PERTINENT LABORATORY DATA: Glucose 165, BUN 69, creatinine 3.8, sodium 143, potassium 3.6, chloride 109, CO2 22, ALT 23, AST 25, alkaline phosphatase 76. Troponin less than 0.02. White blood cell 8.3, hemoglobin 8.8, hematocrit 26.1, platelets 177. INR more than 15.   ASSESSMENT AND PLAN:  1. Coagulopathy. The patient presents with INR of more than 15. This is most likely related to her warfarin use. Will hold her warfarin. Already given 10 of IV vitamin K. She received 4 units of fresh frozen plasma. Will check the patient's INR, and if needed, will give further FFPs at this point.  2. Anemia. This is most likely due to gastrointestinal bleed, as the patient's dry CT chest, abdomen and pelvis did not show any evidence of fluid collection or bleed. She reports bright red blood per rectum. She had significant drop of  almost 4 grams of hemoglobin from her last value in February. She will be given 1 unit of packed red blood cells. Will monitor her hemoglobin every 6 hours. Will start her on a Protonix drip even though I think the bleed is most likely lower as it is more bright red blood per rectum. Will consult GI service, Dr. Vira Agar. We already discussed with him. The patient will be kept n.p.o. meanwhile. 3. Chronic kidney disease, appears to be having acute on chronic kidney disease, worsening from baseline 2.8 to 3.8 today. Will consult nephrology, Dr. Juleen China, already discussed with him.  4. Incidental finding of pancreatic mass on CT chest. Will consult GI for further evaluation. 5. Hypertension. Will hold the patient's home medications until she is more stable.  6. History of deep vein thrombosis. Currently, will hold the patient's warfarin due to her significant bleed.  7. Deep vein thrombosis prophylaxis. TED hose.   CODE STATUS: Discussed with the patient, her daughter and her son-in-law at bedside. Reports that she has a living will. Her daughter is her healthcare power of attorney and reports she is DNR.   TOTAL TIME SPENT ON ADMISSION AND PATIENT CARE: 60 minutes.    ____________________________ Albertine Patricia, MD dse:lb D: 01/21/2014 11:20:01 ET T: 01/21/2014 12:33:17 ET JOB#: [id number]  cc: Albertine Patricia, MD, <Dictator> Allicia Culley Graciela Husbands MD ELECTRONICALLY SIGNED 01/21/2014 14:31

## 2015-02-10 NOTE — Op Note (Signed)
PATIENT NAME:  Jeanette Graham, Jeanette Graham MR#:  [id number] DATE OF BIRTH:  03-11-1928  DATE OF PROCEDURE:  02/27/2014  PREOPERATIVE DIAGNOSIS:  Pancreatic cancer with limited venous access.   POSTOPERATIVE DIAGNOSIS:  Pancreatic cancer with limited venous access.   PROCEDURES:  1.  Ultrasound guidance for vascular access, right internal jugular vein.  2.  Fluoroscopic guidance for placement of catheter.  3.  Placement of CT compatible Port-A-Cath, right internal jugular vein.   SURGEON: Algernon Huxley, MD  ANESTHESIA:  Local with moderate conscious sedation.   FLUOROSCOPY TIME:  Less than 1 minute.   CONTRAST:  Zero.   ESTIMATED BLOOD LOSS:  Minimal.   INDICATION FOR PROCEDURE:  This is an 81 year old female with pancreatic cancer. We are asked to place a Port-A-Cath for chemotherapy and durable venous access. Risks and benefits were discussed. Informed consent was obtained.   DESCRIPTION OF THE PROCEDURE:  The patient was brought to the vascular and interventional radiology suite. The right neck and chest were sterilely prepped and draped, and a sterile surgical field was created. Ultrasound was used to help visualize a patent right internal jugular vein. This was then accessed under direct ultrasound guidance without difficulty with a Seldinger needle and a permanent image was recorded. A J-wire was placed. After skin nick and dilatation, the peel-away sheath was then placed over the wire. I then anesthetized an area under the clavicle approximately 2 fingerbreadths. A transverse incision was created and an inferior pocket was created with electrocautery and blunt dissection. The port was then brought onto the field, placed into the pocket and secured to the chest wall with 2 Prolene sutures. The catheter was connected to the port and tunneled from the subclavicular incision to the access site. Fluoroscopic guidance was used to cut the catheter to an appropriate length. The catheter was then placed  through the peel-away sheath and the peel-away sheath was removed. The catheter tip was parked in excellent location in the cavoatrial junction region. The pocket was then irrigated with antibiotic-impregnated saline and the wound was closed with a running 3-0 Vicryl and a 4-0 Monocryl. The access incision was closed with a single 4-0 Monocryl. The Huber needle was used to withdraw blood and flush the port with heparinized saline. Dermabond was then placed as a dressing. The patient tolerated the procedure well and was taken to the recovery room in stable condition.    ____________________________ Algernon Huxley, MD jsd:jcm D: 02/27/2014 13:59:11 ET T: 02/27/2014 14:32:37 ET JOB#: [id number]  cc: Algernon Huxley, MD, <Dictator> Algernon Huxley MD ELECTRONICALLY SIGNED 03/02/2014 15:33

## 2015-02-10 NOTE — Consult Note (Signed)
History of Present Illness:  Reason for Consult Supratherapeutic INR, possible pancreatic mass.   HPI   Patient is an 81 year old female who presented to the emergency room with increased bleeding and bruising and was found to have an INR greater than 15. Subsequent workup included a CT scan without contrast of the abdomen that suggested a possible pancreatic head mass. Patient otherwise feels well. She has no neurologic complaints. She denies any recent fevers. She has a good appetite and denies weight loss. She denies any chest pain or shortness of breath. She denies any abdominal pain. She denies any nausea, vomiting, constipation, or diarrhea. She has no urinary complaints. Patient otherwise feels well and offers no further specific complaints.  PFSH:  Additional Past Medical and Surgical History chronic kidney disease with left arm fistula, patient has not started dialysis.  hypertension, IBS, hyperlipidemia, arthritis, DVT x3 on chronic Coumadin therapy. Appendectomy, hysterectomy, benign breast biopsy, hernia repair.  Family history: CHF.  Social history: Patient denies tobacco and alcohol.   Review of Systems:  Performance Status (ECOG) 0   Review of Systems   As per HPI. Otherwise, 10 point system review was negative.   NURSING NOTES: **Vital Signs.:   05-Apr-15 08:00   Vital Signs Type: Routine   Temperature Temperature (F): 98.6   Celsius: 37   Temperature Source: oral   Pulse Pulse: 72   Respirations Respirations: 15   Systolic BP Systolic BP: 169   Diastolic BP (mmHg) Diastolic BP (mmHg): 74   Mean BP: 107   Pulse Ox % Pulse Ox %: 98   Pulse Ox Activity Level: At rest   Oxygen Delivery: 2L   Pulse Ox Heart Rate: 68   Physical Exam:  Physical Exam General: Well-developed, well-nourished, no acute distress. Eyes: Pink conjunctiva, anicteric sclera. HEENT: Normocephalic, moist mucous membranes, clear oropharnyx. Lungs: Clear to auscultation  bilaterally. Heart: Regular rate and rhythm. No rubs, murmurs, or gallops. Abdomen: Soft, nontender, nondistended. No organomegaly noted, normoactive bowel sounds. Musculoskeletal: No edema, cyanosis, or clubbing. Neuro: Alert, answering all questions appropriately. Cranial nerves grossly intact. Skin: No rashes or petechiae noted.  fistula noted in left forearm. Psych: Normal affect. Lymphatics: No cervical, calvicular, axillary or inguinal LAD.    Iodine: Blisters  Shellfish: GI Distress, N/V/Diarrhea    folic acid: 678 microgram(s) orally once a day, Status: Active, Quantity: 0, Refills: None   iron: 27 milligram(s) orally once a day, Status: Active, Quantity: 0, Refills: None   furosemide 40 mg oral tablet: 1 tab(s) orally once a day "do not take if you feel dehydrated", Status: Active, Quantity: 0, Refills: None   traMADol 50 mg oral tablet:  orally take 1/2 to 1 tab two times a day as needed for pain, Status: Active, Quantity: 0, Refills: None   Metoprolol Succinate ER 100 mg oral tablet, extended release: 1 tab(s) orally once a day, Status: Active, Quantity: 0, Refills: None   Coumadin:  orally once a day, Status: Active, Quantity: 0, Refills: None   sodium bicarbonate: 325 milligram(s) orally unknown frequency, Status: Active, Quantity: 0, Refills: None  Laboratory Results: Routine Chem:  05-Apr-15 03:22   Glucose, Serum 98  BUN  66  Creatinine (comp)  3.13  Sodium, Serum  147  Potassium, Serum  3.3  Chloride, Serum  113  CO2, Serum 27  Calcium (Total), Serum  8.3  Anion Gap 7  Osmolality (calc) 311  eGFR (African American)  15  eGFR (Non-African American)  13 (eGFR values <25m/min/1.73 m2  may be an indication of chronic kidney disease (CKD). Calculated eGFR is useful in patients with stable renal function. The eGFR calculation will not be reliable in acutely ill patients when serum creatinine is changing rapidly. It is not useful in  patients on dialysis. The  eGFR calculation may not be applicable to patients at the low and high extremes of body sizes, pregnant women, and vegetarians.)  Routine Hem:  05-Apr-15 03:22   Hemoglobin (CBC)  8.5  WBC (CBC) 4.1  RBC (CBC)  2.76  Hematocrit (CBC)  25.0  Platelet Count (CBC)  87  MCV 90  MCH 30.8  MCHC 34.1  RDW  20.1  Neutrophil % 69.0  Lymphocyte % 15.8  Monocyte % 13.9  Eosinophil % 0.8  Basophil % 0.5  Neutrophil # 2.8  Lymphocyte #  0.6  Monocyte # 0.6  Eosinophil # 0.0  Basophil # 0.0 (Result(s) reported on 22 Jan 2014 at 04:07AM.)   Radiology Results: CT:    04-Apr-15 10:08, CT Chest Abdomen and Pelvis WO  CT Chest Abdomen and Pelvis WO   REASON FOR EXAM:    (1) back and abd pain, INR > 15, eval for possible   bleeding, cannot tolerate IV  COMMENTS:       PROCEDURE: CT  - CT CHEST ABDOMEN AND PELVIS WO  - Jan 21 2014 10:08AM     CLINICAL DATA:  Back pain with multiple bruises. Elevated INR. No IV  access.    EXAM:  CT CHEST, ABDOMEN AND PELVIS WITHOUT CONTRAST    TECHNIQUE:  Multidetector CT imaging of the chest, abdomen and pelvis was  performed following the standard protocol without IV contrast.  COMPARISON:  None.    FINDINGS:  CT CHEST FINDINGS    Within the limitations of noncontrast technique, no enlarged  mediastinal, hilar or axillary lymph nodes are identified. There is  a moderate size hiatal hernia.    The heart size is normal. There are small pericardial and pleural  effusions bilaterally. There is mild diffuse atherosclerosis of the  aorta, great vessels and coronary arteries.    The lungs demonstrate mild emphysema and bibasilar atelectasis.  There is a calcified right upper lobe granuloma on image 31. There  is a noncalcified 4 mm right middle lobe nodule on image 41. No  other nodules are identified.    The bones are diffusely demineralized. No acute osseous findings or  metastases identified.    CT ABDOMEN AND PELVIS FINDINGS    The  gallbladder is distended. There is mild pericholecystic edema.  There is suspicion of intra and extrahepatic biliary dilatation,  suboptimally evaluated without intravenous contrast. Within the  pancreatic head is an isodense mass measuring 4.4 x 3.9 cm on image  57. The pancreatic body and tail are atrophied. The pancreatic duct  appears dilated to approximately 9 mm. There is no surrounding  inflammatory change. There are probable small lymph nodes within the  porta hepatis.    There is a possible low-density lesion in the dome of the right  hepatic lobe measuring a 8 mm on image 45. No other focal lesions  are seen within the liver. There calcified splenic granulomas. The  adrenal glands appear normal.    Both kidneys demonstrate cortical thinning and exophytic lesions.  Most of these are low in density. There is a 1.7 cm hyperdense  lesion involving the anterior interpolar region of the right kidney  (image 65). Within the lower pole of the left  kidney is a 1.2 cm  hyperdense lesion on image 68. There is mild asymmetric perinephric  soft tissue stranding on the right, but no hydronephrosis or  evidence of ureteral calculus.  The stomach, small bowel and colon demonstrate no acute findings.  There is no evidence retroperitoneal or intra-abdominal hematoma.  Relatively mild aortoiliac atherosclerosis is noted.    There is no pelvic mass status post hysterectomy. The bladder  appears normal.    No acute or worrisome osseous findings are demonstrated. There are  degenerative changes throughout the spine associated with a convex  left lumbar scoliosis.     IMPRESSION:  1. Suspicion of pancreatic mass with associated atrophy is and  pancreatic ductal dilatation concerning for pancreatic  adenocarcinoma. This finding is suboptimally evaluated on this  examination performed without intravenous contrast. If the patient  is able, this would be best evaluated with nonemergent MRCP  without  and with contrast.  2. Possible adjacent lymph nodes, potentially metastases.  3. No acute findings demonstrated within the chest, abdomen or  pelvis.  4. Small pleural effusions, moderate size hiatal hernia and  atherosclerosis noted.  5. Multiple exophytic renal lesions bilaterally, some hyperdense and  indeterminate.      Electronically Signed    By: Clydia Llano M.D.    On: 01/21/2014 10:39     Verified By: Vivia Ewing, M.D.,   Assessment and Plan: Impression:   Supratherapeutic INR, possible pancreatic mass. Plan:   1. Pancreatic mass: Highly suspicious for underlying malignancy. CA 19-9 and MRI of abdomen are pending. Cannot use CT with contrast given her poor renal function. Have also ordered a PET for further evaluation. Appreciate GI input. If MRI or PET is concerning, patient will likely need EUS in the near future to confirm the diagnosis. Will consider palliative care consult as well.Supratherapeutic INR: Resolved with FFP and vitamin K. Patient with history of multiple DVTs and if no malignancy is found will likely warrant a hypercoagulable workup.Chronic renal insufficiency: Patient has not yet started on dialysis. Appreciate neurology input.  consult, will follow.  Electronic Signatures: Delight Hoh (MD)  (Signed 05-Apr-15 10:39)  Authored: HISTORY OF PRESENT ILLNESS, PFSH, ROS, NURSING NOTES, PE, ALLERGIES, HOME MEDICATIONS, LABS, OTHER RESULTS, ASSESSMENT AND PLAN   Last Updated: 05-Apr-15 10:39 by Delight Hoh (MD)

## 2015-02-10 NOTE — Consult Note (Signed)
PATIENT NAME:  Jeanette Graham, Jeanette Graham MR#:  [id number] DATE OF BIRTH:  1928-03-13  CARDIOLOGY CONSULTATION   DATE OF CONSULTATION:  01/24/2014  CONSULTING PHYSICIAN:  Dionisio David, MD  INDICATION FOR CONSULTATION: Atrial fibrillation/elevated INR.   HISTORY OF PRESENT ILLNESS: This is an 81 year old white female with a past medical history of multiple DVTs, on anticoagulation with warfarin, chronic kidney disease and having AV fistula for possible hemodialysis, hypertension and hyperlipidemia, who presented to the Emergency Room with low back pain, anemia and bright red blood per rectum. I was asked to evaluate the patient because of possible atrial fibrillation in the past, but there is no documentation that the patient has atrial fibrillation. Her INR was 12.1, and then it went down to 1.2 after getting FFP 4 units and 1 unit of packed red blood cells and 10 mg vitamin K.   PAST MEDICAL HISTORY: History of chronic kidney disease, hypertension, irritable bowel syndrome, hyperlipidemia, arthritis, DVT in the past, also history of appendectomy.   SOCIAL HISTORY: Unremarkable. No history of EtOH abuse or smoking.   FAMILY HISTORY: Positive for CHF.   MEDICATIONS: Sodium bicarbonate, tramadol, warfarin, metoprolol, Lasix, folic acid, iron pills.   PHYSICAL EXAMINATION:  GENERAL: She is alert and oriented with a lot of ecchymotic spots on her arms.  VITAL SIGNS: Her blood pressure is 166/85, respirations 18, pulse is 95, temperature 98.3, saturation 96.  NECK: Revealed no JVD.  LUNGS: Clear.  HEART: Regular rate and rhythm. Normal S1, S2. No audible murmur.  ABDOMEN: Soft, nontender. Positive bowel sounds.  EXTREMITIES: No pedal edema.  NEUROLOGIC: She appears intact.   DIAGNOSTIC STUDIES: Monitor shows that she is in sinus rhythm. EKG shows also sinus rhythm with frequent atrial premature contractions, low voltage, heart rate is 74, nonspecific ST-T changes. Her INR, as mentioned, was very high  when she first came. It was 15.1 and 15.4. Her hemoglobin was 8.5. Right now, the hemoglobin is 9.5, and the INR has come down to 1.2.   ASSESSMENT AND PLAN: The patient has supposedly history of atrial fibrillation. There is no documentation that the patient has been in atrial fibrillation. Currently, is in sinus rhythm. Most likely, she was taking anticoagulants because of her deep vein thrombosis x2, not because of the atrial fibrillation. INR is now subtherapeutic. Advise getting ultrasound of the legs, and if that is negative, will consider not restarting the Coumadin. Also, will look at the echocardiogram to see if there are any abnormalities.   Thank you very much for referral.   ____________________________ Dionisio David, MD sak:lb D: 01/24/2014 08:20:10 ET T: 01/24/2014 08:35:26 ET JOB#: [id number]  cc: Dionisio David, MD, <Dictator> Dionisio David MD ELECTRONICALLY SIGNED 02/24/2014 11:02

## 2015-02-10 NOTE — Op Note (Signed)
PATIENT NAME:  Jeanette Graham, Jeanette Graham MR#:  [id number] DATE OF BIRTH:  08-17-1928  DATE OF PROCEDURE:  01/25/2014  PREOPERATIVE DIAGNOSES:  1.  Gastrointestinal bleed.  2.  Multiple previous deep vein thromboses.  3.  Pancreatic mass.   POSTOPERATIVE DIAGNOSES: 1.  Gastrointestinal bleed.  2.  Multiple previous deep vein thromboses.  3.  Pancreatic mass.   PROCEDURE: 1.  Ultrasound guidance for vascular access to right femoral vein.  4.  Catheter placement into IVC.  5.  Inferior venacavogram.  6.  Placement of a Bard Denali IVC filter.   SURGEON: Algernon Huxley, M.D.   ANESTHESIA: Local with moderate conscious sedation.   ESTIMATED BLOOD LOSS: Minimal.   FLUOROSCOPY TIME: 2 minutes.   CONTRAST USED: 30 mL.   INDICATION FOR PROCEDURE: This is an 81 year old individual who is admitted with GI bleed. She has a pancreatic mass and is jaundiced. She has multiple previous DVTs and has previously been on anticoagulation. She needs an IVC filter for protection. Risks and benefits were discussed. Informed consent is obtained.   DESCRIPTION OF PROCEDURE: The patient is brought to the vascular suite. Groins were shaved and prepped and a sterile surgical field was created. The femoral vein was visualized with ultrasound and found to be patent. It was then accessed under direct ultrasound guidance without difficulty with a Seldinger needle. A J-wire and delivery sheath was then placed. Our initial image with the sheath in the IVC showed the cava just below the renal veins to be quite large in the 28-31 mm range. The suprarenal cava was also large and I did not that feel placement of a typical filter in these locations was likely to be safe. I pulled the delivery sheath back to the iliac vein and considered placement into each iliac vein however, just above the iliac confluence and the 1st portion of the IVC it measured in the 25-27 mm range and did appear to be adequate for filter placement. The filter for  this reason deployed just above the iliac confluence in the lower IVC in good location. Then the delivery sheath was removed. Pressure was held. Sterile dressing was placed. The patient tolerated the procedure well.   ____________________________ Algernon Huxley, MD jsd:tc D: 01/25/2014 14:52:07 ET T: 01/25/2014 17:47:05 ET JOB#: [id number]  cc: Algernon Huxley, MD, <Dictator> Algernon Huxley MD ELECTRONICALLY SIGNED 02/02/2014 15:28

## 2015-02-10 NOTE — Consult Note (Signed)
Consult seen for GI bleeding, likely lower GI. Can have water for now. Will follow with you.  See dictated note.  Electronic Signatures: Manya Silvas (MD)  (Signed on 04-Apr-15 13:43)  Authored  Last Updated: 04-Apr-15 13:43 by Manya Silvas (MD)

## 2015-02-10 NOTE — Consult Note (Signed)
Discussed with Dr. Grayland Ormond, given probable pancreatic cancer I will not schedule colonoscopy or EGD for work up of her recent bleed since it happened in the face of severe over anticoagulation.  Call if I can be of assistance.  Electronic Signatures: Manya Silvas (MD)  (Signed on 06-Apr-15 16:51)  Authored  Last Updated: 06-Apr-15 16:51 by Manya Silvas (MD)

## 2015-02-10 NOTE — Consult Note (Signed)
Brief Consult Note: Diagnosis: GI bleed, Pancreatic mass, history of multiple DVT.   Patient was seen by consultant.   Recommend to proceed with surgery or procedure.   Orders entered.   Comments: I have discussed the indications for filter placement with the patient she voices understanding.   I have offered to place the filter tonight but she would like to have it placed in the morning. She is informed of the risks.  Electronic Signatures: Hortencia Pilar (MD)  (Signed 07-Apr-15 20:10)  Authored: Brief Consult Note   Last Updated: 07-Apr-15 20:10 by Hortencia Pilar (MD)
